# Patient Record
Sex: Male | Born: 1965 | Race: White | Hispanic: No | Smoking: Never smoker
Health system: Southern US, Community
[De-identification: ages and names within clinical notes are randomized; demographics above are authoritative.]

## PROBLEM LIST (undated history)

## (undated) DIAGNOSIS — R002 Palpitations: Secondary | ICD-10-CM

## (undated) DIAGNOSIS — E785 Hyperlipidemia, unspecified: Secondary | ICD-10-CM

## (undated) DIAGNOSIS — M069 Rheumatoid arthritis, unspecified: Secondary | ICD-10-CM

## (undated) DIAGNOSIS — I10 Essential (primary) hypertension: Secondary | ICD-10-CM

## (undated) HISTORY — DX: Hyperlipidemia, unspecified: E78.5

## (undated) HISTORY — DX: Rheumatoid arthritis, unspecified: M06.9

## (undated) HISTORY — DX: Essential (primary) hypertension: I10

## (undated) HISTORY — DX: Palpitations: R00.2

---

## 2012-08-04 ENCOUNTER — Other Ambulatory Visit: Payer: Self-pay | Admitting: Occupational Medicine

## 2012-08-04 ENCOUNTER — Ambulatory Visit (HOSPITAL_BASED_OUTPATIENT_CLINIC_OR_DEPARTMENT_OTHER)
Admission: RE | Admit: 2012-08-04 | Discharge: 2012-08-04 | Disposition: A | Discharge status: Home / Self Care | Payer: Managed Care, Other (non HMO) | Source: Ambulatory Visit | Attending: Occupational Medicine | Admitting: Occupational Medicine

## 2012-08-04 DIAGNOSIS — Z0000 Encounter for general adult medical examination without abnormal findings: Secondary | ICD-10-CM | POA: Insufficient documentation

## 2012-08-04 DIAGNOSIS — M069 Rheumatoid arthritis, unspecified: Secondary | ICD-10-CM | POA: Insufficient documentation

## 2012-12-24 ENCOUNTER — Ambulatory Visit (INDEPENDENT_AMBULATORY_CARE_PROVIDER_SITE_OTHER): Payer: Managed Care, Other (non HMO) | Admitting: Cardiovascular Disease

## 2012-12-24 ENCOUNTER — Encounter: Payer: Self-pay | Admitting: Cardiovascular Disease

## 2012-12-24 VITALS — BP 126/86 | HR 95 | Ht 68.0 in | Wt 199.0 lb

## 2012-12-24 DIAGNOSIS — R002 Palpitations: Secondary | ICD-10-CM

## 2012-12-24 DIAGNOSIS — R072 Precordial pain: Secondary | ICD-10-CM

## 2012-12-24 DIAGNOSIS — R079 Chest pain, unspecified: Secondary | ICD-10-CM | POA: Insufficient documentation

## 2012-12-24 DIAGNOSIS — M069 Rheumatoid arthritis, unspecified: Secondary | ICD-10-CM | POA: Insufficient documentation

## 2012-12-24 NOTE — Patient Instructions (Addendum)
Your physician has requested that you have an exercise stress myoview. For further information please visit [email or web address] Please follow instruction sheet, as given. (The pt is currently out of Atenolol--we will await the results of Myoview before instructing the pt if he should continue taking this medication)  Your physician wants you to follow-up in: 1 YEAR with Dr Excell Seltzer. You will receive a reminder letter in the mail two months in advance. If you don't receive a letter, please call our office to schedule the follow-up appointment.

## 2012-12-24 NOTE — Progress Notes (Addendum)
HPI:  47 year old gentleman presenting for cardiac evaluation. The patient has no personal history of heart disease, but he is concerned about his strong cardiac risk profile. He has hyperlipidemia, rheumatoid arthritis, and family history of CAD and abdominal aortic aneurysm.  He works as a IT sales professional. He is currently going through a divorce and has been married to a Musician. He's had 2 recent episodes of tachypalpitations and flushing. He did not have associated chest pain or shortness of breath with these episodes. He was at rest both times and he was sent home from work. It was noted that his blood pressure was markedly elevated on both occasions.  In addition, the patient has had recent episodes of shortness of breath with exertion. He's had episodic chest pain but nothing consistently related to exertion. His rheumatoid arthritis is been well controlled on methotrexate. His lipids have apparently been under good control.  He denies edema, orthopnea, PND, lightheadedness, or syncope. His blood pressure has not recently been elevated.  Outpatient Encounter Prescriptions as of 12/24/2012  Medication Sig Dispense Refill  . Ascorbic Acid (VITAMIN C) 500 MG CHEW Chew 500 mg by mouth 1 day or 1 dose.      Marland Kitchen atorvastatin (LIPITOR) 80 MG tablet Take 80 mg by mouth daily.      . B Complex Vitamins (VITAMIN B COMPLEX) TABS TAKE ONE TABLET DAILY      . fenofibrate 160 MG tablet Take 160 mg by mouth daily.      . folic acid (FOLVITE) 1 MG tablet Take 1 mg by mouth daily.      Marland Kitchen HUMIRA PEN 40 MG/0.8ML injection USE ONCE EVERY TWO WEEKS      . methocarbamol (ROBAXIN) 750 MG tablet TAKE ONE TABLET DAILY      . methotrexate (RHEUMATREX) 2.5 MG tablet TAKE 8 TABLETS ONCE A WEEK      . Omega-3 Fatty Acids (FISH OIL) 300 MG CAPS Take 300 mg by mouth 1 day or 1 dose.      Marland Kitchen atenolol (TENORMIN) 25 MG tablet TAKE ONE TABLET DAILY      . pantoprazole (PROTONIX) 40 MG tablet TAKE ONE TABLET DAILY        No facility-administered encounter medications on file as of 12/24/2012.    Codeine  Past Medical History  Diagnosis Date  . Hyperlipidemia   . Rheumatoid arthritis   . High blood pressure     No past surgical history on file.  History   Social History  . Marital Status: Legally Separated    Spouse Name: N/A    Number of Children: N/A  . Years of Education: N/A   Occupational History  . Not on file.   Social History Main Topics  . Smoking status: Never Smoker   . Smokeless tobacco: Not on file  . Alcohol Use: No  . Drug Use: No  . Sexually Active: Not on file   Other Topics Concern  . Not on file   Social History Narrative   The patient works as a Company secretary. He is a nonsmoker. He's going through divorce.   Family history: Patient's father had CABG at age 48. He had an abdominal aortic aneurysm discovered at age 50. All of his siblings have hypertension and several have diabetes.  ROS:  General: no fevers/chills/night sweats Eyes: no blurry vision, diplopia, or amaurosis ENT: no sore throat or hearing loss Resp: no cough, wheezing, or hemoptysis CV: no edema , otherwise see history of present illness  GI: no abdominal pain, nausea, vomiting, diarrhea, or constipation GU: no dysuria, frequency, or hematuria Skin: no rash Neuro: no headache, numbness, tingling, or weakness of extremities Musculoskeletal: Positive for joint pains Heme: no bleeding, DVT, or easy bruising Endo: no polydipsia or polyuria  BP 126/86  Pulse 95  Ht 5\' 8"  (1.727 m)  Wt 90.266 kg (199 lb)  BMI 30.26 kg/m2  SpO2 96%  PHYSICAL EXAM: Pt is alert and oriented, WD, WN, pleasant overweight male in no distress. HEENT: normal Neck: JVP normal. Carotid upstrokes normal without bruits. No thyromegaly. Lungs: equal expansion, clear bilaterally CV: Apex is discrete and nondisplaced, RRR without murmur or gallop Abd: soft, NT, +BS, no bruit, no hepatosplenomegaly Back: no CVA  tenderness Ext: no C/C/E        Femoral pulses 2+= without bruits        DP/PT pulses intact and = Skin: warm and dry without rash Neuro: CNII-XII intact             Strength intact = bilaterally  EKG:  Normal sinus rhythm 95 beats per minute, within normal limits.  ASSESSMENT AND PLAN: 47 year old gentleman with tachypalpitations, chest pain, and shortness of breath. He does have a strong risk profile includes family history of CAD, rheumatoid arthritis, and hyperlipidemia. I have recommended that he undergo an exercise stress Myoview scan for risk stratification. I've also advised that he take an aspirin 81 mg daily. I don't think a Holter monitor or event recorder as necessary at this time. If he develops more frequent palpitations we should pursue outpatient telemetry but this is not currently indicated. I would like to see him back in one year for followup unless his stress test shows a significant abnormality. We reviewed the importance of lifestyle modification as part of his treatment program.  [name] 02/04/2013 11:05 PM

## 2013-01-01 ENCOUNTER — Other Ambulatory Visit: Payer: Self-pay

## 2013-01-01 DIAGNOSIS — R079 Chest pain, unspecified: Secondary | ICD-10-CM

## 2013-01-01 DIAGNOSIS — R002 Palpitations: Secondary | ICD-10-CM

## 2013-01-04 ENCOUNTER — Encounter (HOSPITAL_COMMUNITY): Payer: Managed Care, Other (non HMO)

## 2013-01-05 ENCOUNTER — Ambulatory Visit (HOSPITAL_COMMUNITY): Payer: Managed Care, Other (non HMO) | Attending: Cardiology | Admitting: Radiology

## 2013-01-05 ENCOUNTER — Encounter: Payer: Self-pay | Admitting: Cardiovascular Disease

## 2013-01-05 ENCOUNTER — Ambulatory Visit (HOSPITAL_COMMUNITY): Payer: Managed Care, Other (non HMO) | Attending: Cardiology

## 2013-01-05 DIAGNOSIS — R079 Chest pain, unspecified: Secondary | ICD-10-CM

## 2013-01-05 DIAGNOSIS — R0989 Other specified symptoms and signs involving the circulatory and respiratory systems: Secondary | ICD-10-CM

## 2013-01-05 DIAGNOSIS — R002 Palpitations: Secondary | ICD-10-CM

## 2013-01-05 DIAGNOSIS — R072 Precordial pain: Secondary | ICD-10-CM | POA: Insufficient documentation

## 2013-01-05 NOTE — Progress Notes (Signed)
Echocardiogram performed.  

## 2013-01-15 ENCOUNTER — Encounter: Payer: Self-pay | Admitting: Cardiovascular Disease

## 2013-01-15 NOTE — Telephone Encounter (Signed)
Pt rtn your call you can reach him at same number °

## 2013-01-15 NOTE — Telephone Encounter (Signed)
This encounter was created in error - please disregard.

## 2013-02-04 ENCOUNTER — Encounter: Payer: Self-pay | Admitting: Cardiovascular Disease

## 2013-08-24 ENCOUNTER — Telehealth: Payer: Self-pay | Admitting: Cardiovascular Disease

## 2013-08-24 NOTE — Telephone Encounter (Signed)
I spoke with the pt and he did not get to fax letter today.  The pt will fax letter tomorrow. I made the pt aware that I will review letter with Dr Excell Seltzer once received and then we can determine if further testing is needed. The pt said that if his insurance does not cover test then the state will pay the bill.

## 2013-08-24 NOTE — Telephone Encounter (Signed)
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 Excell Seltzer prescribed.. Pt will fax the letter for review.. Request a call back from nurse to discuss medications or if another examination is needed.

## 2013-08-26 NOTE — Telephone Encounter (Signed)
Fax received and placed in Dr [employer] folder for review. Based on letter the pt's current job duties are on restriction due to palpitations, dizziness and orthostatic hypotension.

## 2013-08-30 NOTE — Telephone Encounter (Signed)
Follow up  Faxed medical records from [facility] [place] doc, waiting to hear back from you. pls call [phone] before 5

## 2013-08-30 NOTE — Telephone Encounter (Signed)
I spoke with the pt and per Dr Excell Seltzer he recommends a PA/NP appointment to evaluate orthostatic hypotension.  Currently the pt's palpitations are controlled but he does have fatigue and dizziness.  A heart monitor can be ordered from office visit if felt necessary.  I made the pt aware that I will contact him tomorrow with an appointment after speaking with a scheduler.

## 2013-08-31 ENCOUNTER — Telehealth: Payer: Self-pay | Admitting: *Deleted

## 2013-08-31 ENCOUNTER — Ambulatory Visit (INDEPENDENT_AMBULATORY_CARE_PROVIDER_SITE_OTHER): Payer: Managed Care, Other (non HMO) | Admitting: Nurse Practitioner

## 2013-08-31 ENCOUNTER — Encounter: Payer: Self-pay | Admitting: Nurse Practitioner

## 2013-08-31 VITALS — BP 140/92 | HR 71 | Ht 68.0 in | Wt 204.2 lb

## 2013-08-31 DIAGNOSIS — R002 Palpitations: Secondary | ICD-10-CM

## 2013-08-31 NOTE — Telephone Encounter (Signed)
I Sent ov note to pt and medical records to fax a copy to Dr. Nicoletta Dress at [phone]

## 2013-08-31 NOTE — Telephone Encounter (Signed)
Pt scheduled to see Norma Fredrickson NP today.

## 2013-08-31 NOTE — Patient Instructions (Addendum)
I think you are doing ok from our standpoint.   See Dr. Excell Seltzer back as planned in January  Regular exercise and weight loss is encouraged.   Call the [facility] office at [phone] if you have any questions, problems or concerns.

## 2013-08-31 NOTE — Progress Notes (Signed)
Derrick Mendez Date of Birth: 05/14/1966 Medical Record #[id number]  History of Present Illness: Derrick Mendez is seen back today for a work in visit. Seen for Dr. Excell Seltzer. Has no known CAD - negative stress testing earlier this year. Other issues include HLD, RA on methotrexate and family history of CAD and AAA.   Last seen here in January of 2014 - was concerned about his family history. Clinically he was doing ok but had had 2 spells of tachypalpitations and flushing. Was placed on beta blockers and apparently done well since.   Comes back today. 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 Thus added to my schedule for further disposition.   He is here alone today. He has no chest pain. Not short of breath. BP has been doing ok at home. No palpitations whatsoever - was given the atenolol more for symptom relief when seen earlier this year. Has issues with fatigue - thinks this is more related to his RA. Apparently has been tested for OSA - was told borderline - still endorses that he does not feel refreshed in the am. Has just started to embark on an exercise/weight loss program - but then got a cold/URI and has gotten off track the last few days.   Current Outpatient Prescriptions  Medication Sig Dispense Refill  . Ascorbic Acid (VITAMIN C) 500 MG CHEW Chew 500 mg by mouth 1 day or 1 dose.      Marland Kitchen atenolol (TENORMIN) 25 MG tablet TAKE ONE TABLET DAILY      . atorvastatin (LIPITOR) 80 MG tablet Take 80 mg by mouth daily.      . B  Complex Vitamins (VITAMIN B COMPLEX) TABS TAKE ONE TABLET DAILY      . cromolyn (NASALCROM) 5.2 MG/ACT nasal spray Place 2 sprays into the nose 2 (two) times daily.      . fenofibrate 160 MG tablet Take 160 mg by mouth daily.      . folic acid (FOLVITE) 1 MG tablet Take 1 mg by mouth daily.      Marland Kitchen HUMIRA PEN 40 MG/0.8ML injection USE ONCE EVERY TWO WEEKS      . methotrexate (RHEUMATREX) 2.5 MG tablet TAKE 8 TABLETS ONCE A WEEK      . Omega-3 Fatty Acids (FISH OIL) 300 MG CAPS Take 300 mg by mouth 1 day or 1 dose.      . pantoprazole (PROTONIX) 40 MG tablet TAKE ONE TABLET DAILY       No current facility-administered medications for this visit.    Allergies  Allergen Reactions  . Codeine     MAKES PATIENT HALLUCINATE    Past Medical History  Diagnosis Date  . Hyperlipidemia   . Rheumatoid arthritis(714.0)   . HTN (hypertension)   . Palpitation     History reviewed. No pertinent past surgical history.  History  Smoking status  . Never Smoker   Smokeless tobacco  . Not on file    History  Alcohol Use  No    History reviewed. No pertinent family history.  Review of Systems: The review of systems is per the HPI.  All other systems were reviewed and are negative.  Physical Exam: BP 136/94  Pulse 71  Ht 5\' 8"  (1.727 m)  Wt 204 lb 3.2 oz (92.625 kg)  BMI 31.06 kg/m2 Patient is very pleasant and in no acute distress. Skin is warm and dry. Color is normal.  HEENT is unremarkable. Normocephalic/atraumatic. PERRL. Sclera are nonicteric. Neck is supple. No masses. No JVD. Lungs are clear. Cardiac exam shows a regular rate and rhythm. Abdomen is soft. Extremities are without edema. Gait and ROM are intact. No gross neurologic deficits noted.  LABORATORY DATA:   Lying BP was 129/88 with HR of 66 Sitting BP was 141/85 with HR 67 Standing @ 0 minutes BP was 138/88 with HR of 70. Standing @ 2 minutes BP was 136/94 with HR of 71 Standing @ 5 minutes BP was 140/92 with HR of 71.     No results found for this basename: WBC, HGB, HCT, PLT, GLUCOSE, CHOL, TRIG, HDL, LDLDIRECT, LDLCALC, ALT, AST, NA, K, CL, CREATININE, BUN, CO2, TSH, PSA, INR, GLUF, HGBA1C, MICROALBUR   Stress Echo Study from 12/2012 Stress ECG conclusions: The stress ECG was negative for ischemia.  Impressions:  - Normal study after maximal exercise. Bruce protocol. Stress echocardiography. Height: Height: 172.7cm. Height: 68in. Weight: Weight: 89.1kg. Weight: 196lb. Body mass index: BMI: 29.9kg/m^2. Body surface area: BSA: 2.63m^2. Blood pressure: 119/95. Patient status: Outpatient.  ------------------------------------------------------------  ------------------------------------------------------------ Stress protocol:  +---------------------+---+-----------+---+----------------+ Stage HR BP (mmHg) SatSymptoms  +---------------------+---+-----------+---+----------------+ Baseline 80 119/95 96%None    (103)    +---------------------+---+-----------+---+----------------+ Stage 1 112156/91 ---None    (113)    +---------------------+---+-----------+---+----------------+ Stage 2 137170/88 ---None    (115)    +---------------------+---+-----------+---+----------------+ Stage 3 160170/91 96%Dyspnea, fatigue   (117)    +---------------------+---+-----------+---+----------------+ Immediate post [id number] 98%Subsiding    (108)    +---------------------+---+-----------+---+----------------+ Recovery; 1 min 129-----------97%None  +---------------------+---+-----------+---+----------------+ Recovery; 2 min 113139/87 ---None    (104)    +---------------------+---+-----------+---+----------------+ Recovery; 3 min 106--------------None  +---------------------+---+-----------+---+----------------+ Recovery; 4 min 086578/46 ---None    (108)     +---------------------+---+-----------+---+----------------+ Recovery; 5 min 106--------------None  +---------------------+---+-----------+---+----------------+ Late recovery 96 --------------None  +---------------------+---+-----------+---+----------------+  ------------------------------------------------------------ Stress results: Maximal heart rate during stress was 166bpm (96% of maximal predicted heart rate). The maximal predicted heart rate was 173bpm.The target heart rate was achieved. The heart rate response to stress was normal. There was a normal resting blood pressure with an appropriate response to stress. The rate-pressure product for the peak heart rate and blood pressure was Hg/min.  ------------------------------------------------------------ Stress ECG: The stress ECG was negative for ischemia.  ------------------------------------------------------------ Baseline:  - LV global systolic function was normal. The estimated LV ejection fraction was 60%. - Normal wall motion; no LV regional wall motion abnormalities. Immediate post stress:  - LV global systolic function was hyperdynamic. The estimated LV ejection fraction was 75%. - Normal wall motion; no LV regional wall motion abnormalities.  ------------------------------------------------------------ Prepared and Electronically Authenticated by  Cassell Clement 2014-02-11T18:12:25.930   Assessment / Plan: 1. Palpitations - currently without symptoms - on atenolol for symptom relief. I do not feel that an event monitor is warranted at this time since he is asymptomatic.   2. Concern for orthostasis - not identified today. 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   3. + FH for CAD - needs to work on CV risk factors with exercise/weight loss, etc., and treatment of his  lipids. Seems motivated to make changes.   I think from our standpoint he is doing well and is not restricted in regards to his employment requirements. Will see him back as planned in January.   Patient is agreeable to this plan and will call if any problems develop in the interim.   Derrick Macadamia, RN, ANP-C [facility] [address] [place], [place]  [address]

## 2013-09-13 ENCOUNTER — Telehealth: Payer: Self-pay | Admitting: Cardiovascular Disease

## 2013-09-13 NOTE — Telephone Encounter (Signed)
The pt states that Dr Williams Che has not received the documentation from his 08/31/13 office visit.  I will fax his note to [phone].

## 2013-09-13 NOTE — Telephone Encounter (Signed)
New Problem  Pt request a call back for paperwork// No further details

## 2014-01-04 ENCOUNTER — Other Ambulatory Visit: Payer: Self-pay | Admitting: *Deleted

## 2014-01-04 MED ORDER — ATENOLOL 25 MG PO TABS: 25.0000 mg | ORAL_TABLET | Freq: Every day | ORAL | Status: DC

## 2014-03-07 ENCOUNTER — Ambulatory Visit (INDEPENDENT_AMBULATORY_CARE_PROVIDER_SITE_OTHER): Payer: Managed Care, Other (non HMO) | Admitting: Cardiovascular Disease

## 2014-03-07 ENCOUNTER — Encounter: Payer: Self-pay | Admitting: Cardiovascular Disease

## 2014-03-07 VITALS — BP 148/108 | HR 96 | Ht 68.0 in | Wt 199.0 lb

## 2014-03-07 DIAGNOSIS — I10 Essential (primary) hypertension: Secondary | ICD-10-CM

## 2014-03-07 MED ORDER — ATENOLOL 25 MG PO TABS: 25.0000 mg | ORAL_TABLET | Freq: Every day | ORAL | Status: DC

## 2014-03-07 NOTE — Progress Notes (Signed)
    HPI:  48 year old gentleman presenting for followup evaluation. Was seen last year with exertional dyspnea. Also had palpitations and flushing. He had a stress echocardiogram with normal findings. Reassurance was provided. He's also had issues with hypertension. Has been well-controlled until he ran out of atenolol a few days ago. He's had a few episodes of flushing since then. He's had no recurrence of chest pain, chest pressure, or shortness of breath. He denies palpitations. He has been on steroid injections for back and shoulder problems.  Outpatient Encounter Prescriptions as of 03/07/2014  Medication Sig  . Ascorbic Acid (VITAMIN C) 500 MG CHEW Chew 500 mg by mouth 1 day or 1 dose.  Marland Kitchen. atenolol (TENORMIN) 25 MG tablet Take 1 tablet (25 mg total) by mouth daily. TAKE ONE TABLET DAILY  . atorvastatin (LIPITOR) 80 MG tablet Take 80 mg by mouth daily.  . B Complex Vitamins (VITAMIN B COMPLEX) TABS TAKE ONE TABLET DAILY  . cromolyn (NASALCROM) 5.2 MG/ACT nasal spray Place 2 sprays into the nose 2 (two) times daily.  . fenofibrate 160 MG tablet Take 160 mg by mouth daily.  . folic acid (FOLVITE) 1 MG tablet Take 1 mg by mouth daily.  Marland Kitchen. HUMIRA PEN 40 MG/0.8ML injection USE ONCE EVERY TWO WEEKS  . methotrexate (RHEUMATREX) 2.5 MG tablet TAKE 8 TABLETS ONCE A WEEK  . Omega-3 Fatty Acids (FISH OIL) 300 MG CAPS Take 300 mg by mouth 1 day or 1 dose.  . pantoprazole (PROTONIX) 40 MG tablet TAKE ONE TABLET DAILY  . promethazine-dextromethorphan (PROMETHAZINE-DM) 6.25-15 MG/5ML syrup     Allergies  Allergen Reactions  . Codeine     MAKES PATIENT HALLUCINATE    Past Medical History  Diagnosis Date  . Hyperlipidemia   . Rheumatoid arthritis(714.0)   . HTN (hypertension)   . Palpitation    ROS: Negative except as per HPI  BP 148/108  Pulse 96  Ht 5\' 8"  (1.727 m)  Wt 199 lb (90.266 kg)  BMI 30.26 kg/m2  PHYSICAL EXAM: Pt is alert and oriented, NAD HEENT: normal Neck: JVP - normal,  carotids 2+= without bruits Lungs: CTA bilaterally CV: RRR without murmur or gallop Abd: soft, NT, Positive BS, no hepatomegaly Ext: no C/C/E, distal pulses intact and equal Skin: warm/dry no rash  EKG:  Normal sinus rhythm 96 beats per minute, moderate voltage criteria for LVH maybe normal variant.  ASSESSMENT AND PLAN: 1. Hypertension. Blood pressure markedly elevated today. He has just been off atenolol now for a few days. I've asked him to start back on atenolol 25 mg daily. He works at the [employer] and will have his blood pressure checked in about 48 hours. He will call if his readings remain significantly elevated. He notes on the atenolol that a recent blood pressure was 124/78. As long as his blood pressure is under control, I think he can proceed with shoulder surgery without further cardiac testing. Note he did have a stress echocardiogram last year that showed no ischemia.  2. Hyperlipidemia. Patient takes high-dose atorvastatin and is followed by his primary physician.  Derrick BollmanMichael Lee-Ann Mendez 03/07/2014 4:48 PM

## 2014-03-07 NOTE — Patient Instructions (Addendum)
Your physician recommends that you continue on your current medications as directed. Please refer to the Current Medication list given to you today. Please restart Atenolol.   Your physician has requested that you regularly monitor and record your blood pressure readings at home. Please use the same machine at the same time of day to check your readings and record them to bring to your follow-up visit. Please call the office on Friday with your readings.   You are cleared from a cardiac stand point for shoulder surgery.   Your physician wants you to follow-up in: 1 YEAR with Dr Excell Seltzerooper. You will receive a reminder letter in the mail two months in advance. If you don't receive a letter, please call our office to schedule the follow-up appointment.

## 2014-03-14 ENCOUNTER — Telehealth: Payer: Self-pay | Admitting: Cardiovascular Disease

## 2014-03-14 NOTE — Telephone Encounter (Signed)
Following up     Pt reporting his BP readings:   4/17   136/86  4/19   138/90

## 2014-03-14 NOTE — Telephone Encounter (Signed)
Forwarded BP readings to MD for review

## 2014-03-14 NOTE — Telephone Encounter (Signed)
bp improved. Continue same med Rx. thx

## 2014-03-15 NOTE — Telephone Encounter (Signed)
I left a message on the Derrick Mendez's voicemail that Dr Excell Seltzerooper would like him to continue current medications.

## 2014-03-21 IMAGING — CR DG CHEST 1V
1 series · 1 of 1 positions shown · non-contrast
Comparison: None.

CLINICAL DATA: Annual physical exam, history of rheumatoid
arthritis

CHEST - 1 VIEW

[w chest pa]
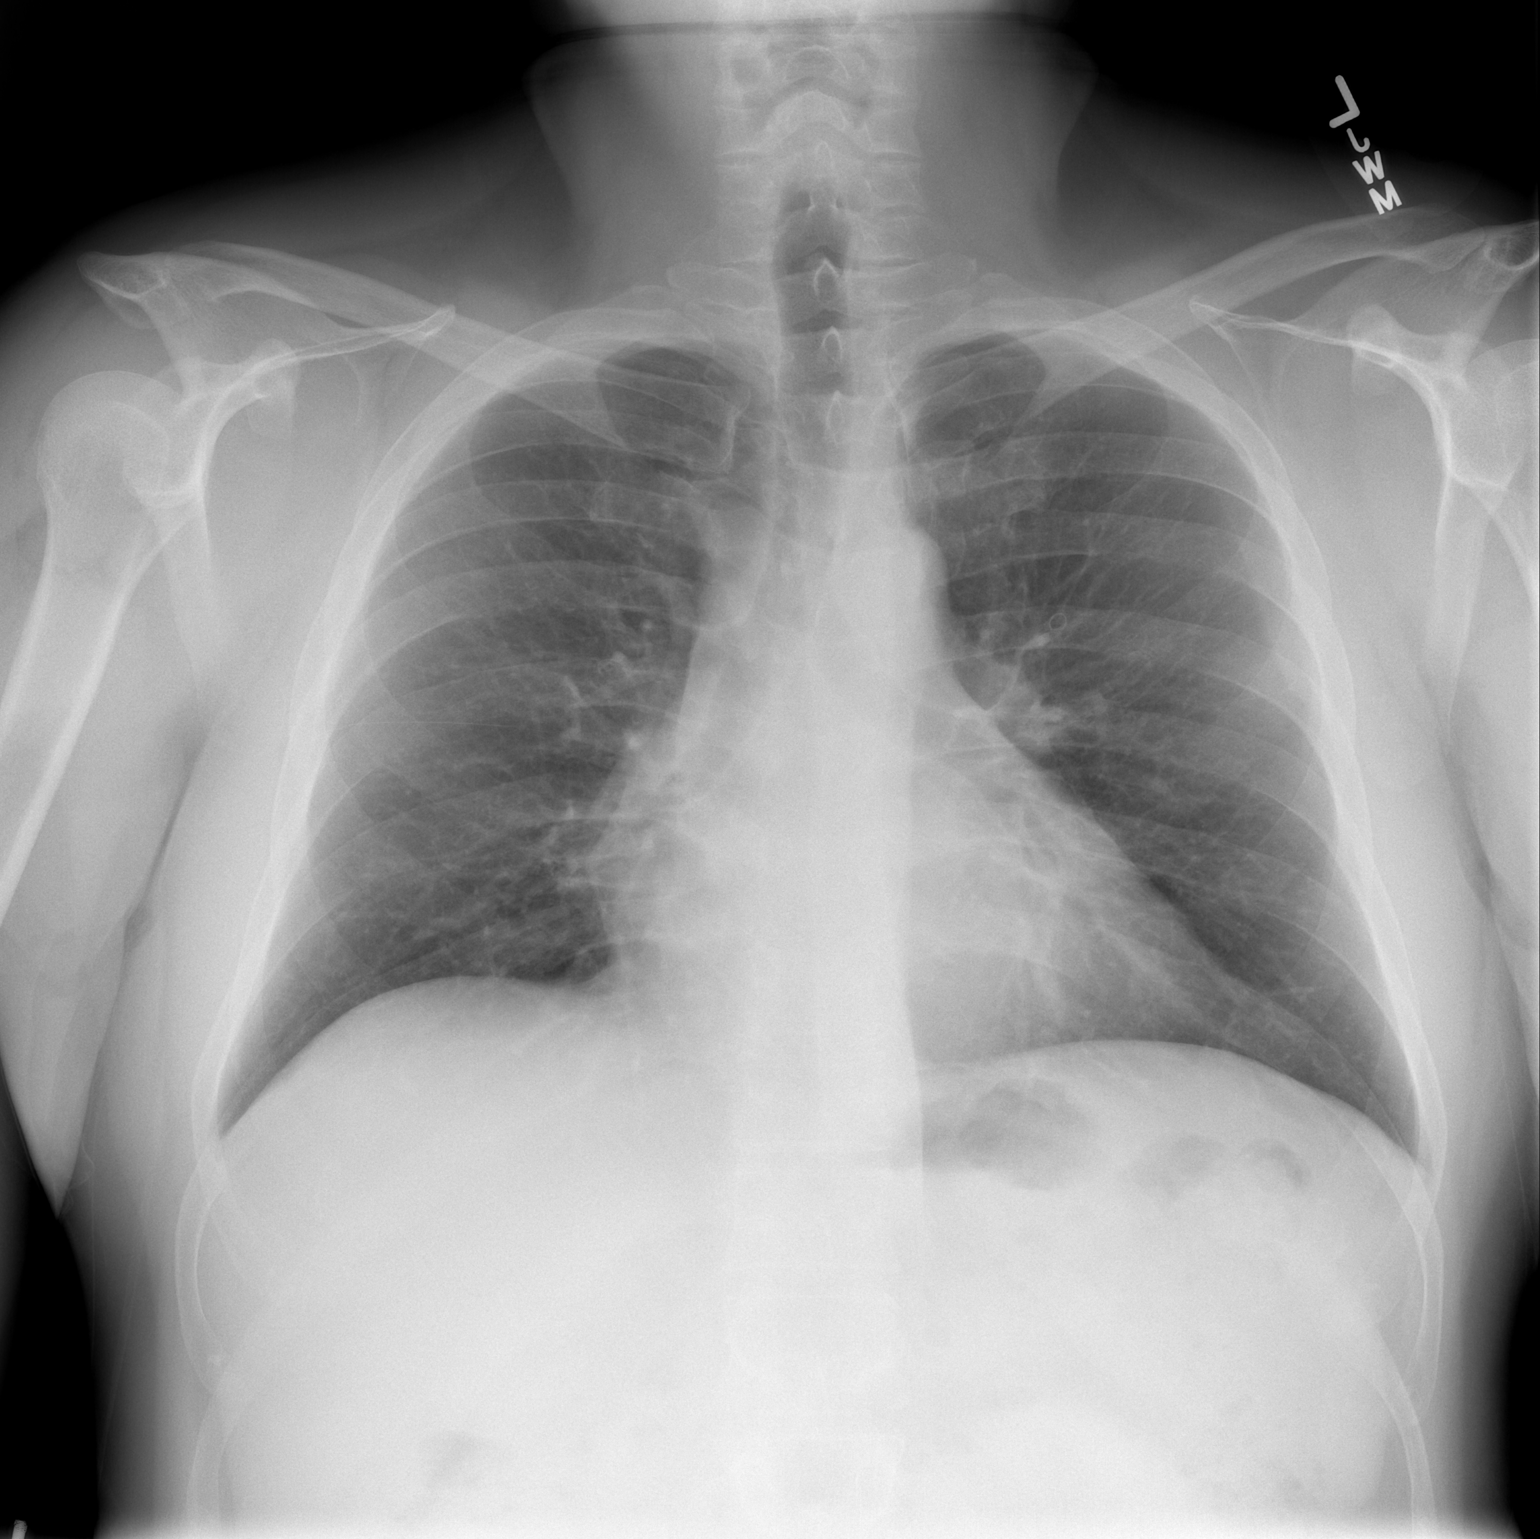

[1 of 1 positions shown; findings below may reference images not displayed]

FINDINGS: Cardiomediastinal silhouette is unremarkable.  No acute
infiltrate or pleural effusion.  No pulmonary edema.  Bony thorax
is unremarkable.  No focal consolidation.
IMPRESSION: No active disease.

## 2014-08-05 ENCOUNTER — Other Ambulatory Visit: Payer: Self-pay | Admitting: *Deleted

## 2014-08-05 DIAGNOSIS — I10 Essential (primary) hypertension: Secondary | ICD-10-CM

## 2014-08-05 MED ORDER — ATENOLOL 25 MG PO TABS: 25.0000 mg | ORAL_TABLET | Freq: Every day | ORAL | Status: AC
